# Patient Record
Sex: Female | Born: 1970 | Race: White | Hispanic: No | Marital: Married | Smoking: Never smoker
Health system: Southern US, Community
[De-identification: ages and names within clinical notes are randomized; demographics above are authoritative.]

## PROBLEM LIST (undated history)

## (undated) DIAGNOSIS — E079 Disorder of thyroid, unspecified: Secondary | ICD-10-CM

## (undated) DIAGNOSIS — E119 Type 2 diabetes mellitus without complications: Secondary | ICD-10-CM

## (undated) DIAGNOSIS — L209 Atopic dermatitis, unspecified: Secondary | ICD-10-CM

## (undated) HISTORY — DX: Disorder of thyroid, unspecified: E07.9

## (undated) HISTORY — PX: AUGMENTATION MAMMAPLASTY: SUR837

## (undated) HISTORY — DX: Atopic dermatitis, unspecified: L20.9

## (undated) HISTORY — PX: OTHER SURGICAL HISTORY: SHX169

## (undated) HISTORY — DX: Type 2 diabetes mellitus without complications: E11.9

---
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
## 1998-10-10 ENCOUNTER — Other Ambulatory Visit: Admission: RE | Admit: 1998-10-10 | Discharge: 1998-10-10 | Payer: Self-pay | Admitting: Obstetrics and Gynecology

## 1999-10-15 ENCOUNTER — Other Ambulatory Visit: Admission: RE | Admit: 1999-10-15 | Discharge: 1999-10-15 | Payer: Self-pay | Admitting: Obstetrics and Gynecology

## 2001-01-16 ENCOUNTER — Other Ambulatory Visit: Admission: RE | Admit: 2001-01-16 | Discharge: 2001-01-16 | Payer: Self-pay | Admitting: Obstetrics and Gynecology

## 2001-04-01 ENCOUNTER — Emergency Department (HOSPITAL_COMMUNITY): Admission: EM | Admit: 2001-04-01 | Discharge: 2001-04-02 | Payer: Self-pay | Admitting: Emergency Medicine

## 2001-04-02 ENCOUNTER — Encounter: Payer: Self-pay | Admitting: Emergency Medicine

## 2001-05-02 ENCOUNTER — Emergency Department (HOSPITAL_COMMUNITY): Admission: EM | Admit: 2001-05-02 | Discharge: 2001-05-02 | Payer: Self-pay | Admitting: Emergency Medicine

## 2001-05-02 ENCOUNTER — Encounter: Payer: Self-pay | Admitting: Emergency Medicine

## 2002-02-10 ENCOUNTER — Emergency Department (HOSPITAL_COMMUNITY): Admission: EM | Admit: 2002-02-10 | Discharge: 2002-02-10 | Payer: Self-pay | Admitting: Emergency Medicine

## 2002-07-07 ENCOUNTER — Observation Stay (HOSPITAL_COMMUNITY): Admission: EM | Admit: 2002-07-07 | Discharge: 2002-07-07 | Payer: Self-pay

## 2002-09-12 ENCOUNTER — Emergency Department (HOSPITAL_COMMUNITY): Admission: EM | Admit: 2002-09-12 | Discharge: 2002-09-12 | Payer: Self-pay | Admitting: Emergency Medicine

## 2003-05-08 ENCOUNTER — Encounter: Payer: Self-pay | Admitting: Endocrinology

## 2003-05-08 ENCOUNTER — Encounter: Admission: RE | Admit: 2003-05-08 | Discharge: 2003-05-08 | Payer: Self-pay | Admitting: Endocrinology

## 2003-05-13 ENCOUNTER — Other Ambulatory Visit: Admission: RE | Admit: 2003-05-13 | Discharge: 2003-05-13 | Payer: Self-pay | Admitting: Obstetrics and Gynecology

## 2003-09-06 ENCOUNTER — Ambulatory Visit (HOSPITAL_COMMUNITY): Admission: RE | Admit: 2003-09-06 | Discharge: 2003-09-06 | Payer: Self-pay | Admitting: Obstetrics and Gynecology

## 2003-10-04 ENCOUNTER — Ambulatory Visit (HOSPITAL_COMMUNITY): Admission: RE | Admit: 2003-10-04 | Discharge: 2003-10-04 | Payer: Self-pay | Admitting: Obstetrics and Gynecology

## 2003-10-28 ENCOUNTER — Ambulatory Visit (HOSPITAL_COMMUNITY): Admission: RE | Admit: 2003-10-28 | Discharge: 2003-10-28 | Payer: Self-pay | Admitting: *Deleted

## 2004-07-04 IMAGING — RF DG HYSTEROGRAM
11 series · 11 of 11 positions shown · IV contrast (omnipaque)
Comparison: none

CLINICAL DATA: Primary infertility.  
 HYSTEROSALPINGOGRAM:  
 After cleansing of the cervix and vagina with Betadine, a hysterosalpingogram was performed using a 5 French hysterosalpingogram catheter and Omnipaque 300 contrast.  The patient tolerated the procedure without difficulty.  
 The endometrial cavity of the uterus is normal in appearance.  
 Opacification of both fallopian tubes is seen, and intraperitoneal spill of contrast from both fallopian tubes is demonstrated.
 IMPRESSION
 Normal study.  Both fallopian tubes are patent.

[Series 1: run · 1 of 1 slices shown (1 of 11)]
[im 1/1]
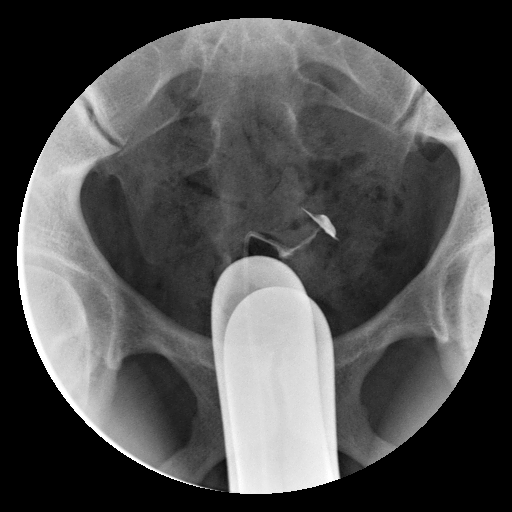

[Series 2: run · 1 of 1 slices shown (2 of 11)]
[im 1/1]
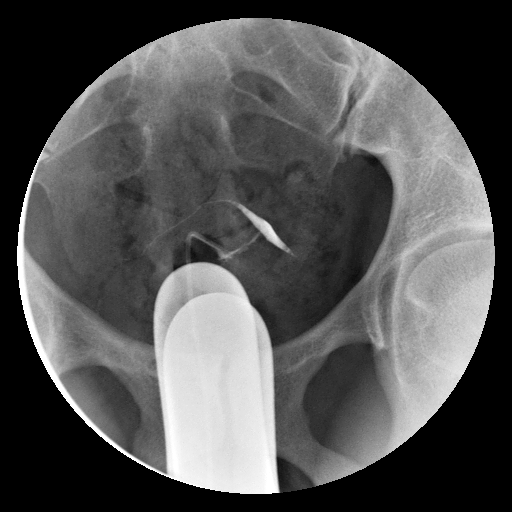

[Series 3: run · 1 of 1 slices shown (3 of 11)]
[im 1/1]
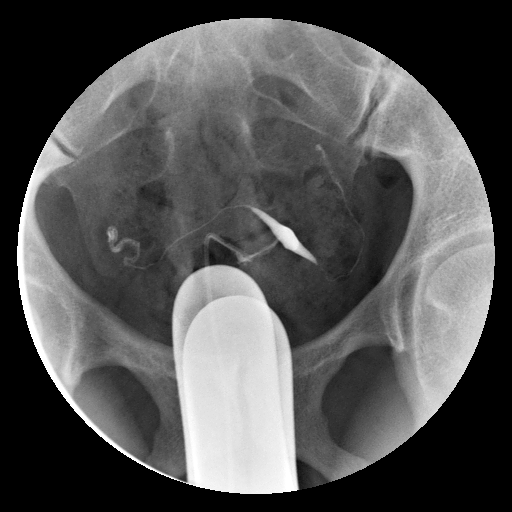

[Series 4: run · 1 of 1 slices shown (4 of 11)]
[im 1/1]
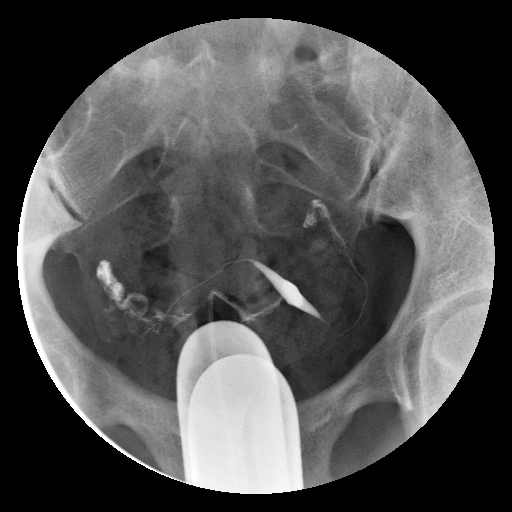

[Series 5: run · 1 of 1 slices shown (5 of 11)]
[im 1/1]
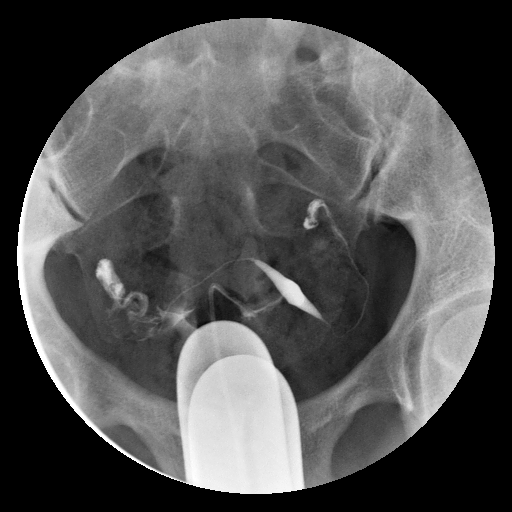

[Series 6: run · 1 of 1 slices shown (6 of 11)]
[im 1/1]
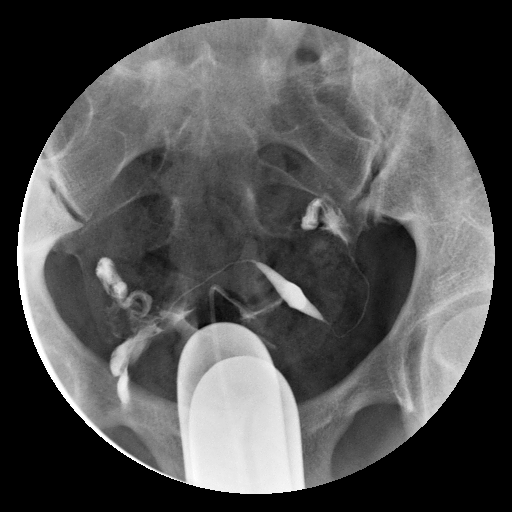

[Series 7: run · 1 of 1 slices shown (7 of 11)]
[im 1/1]
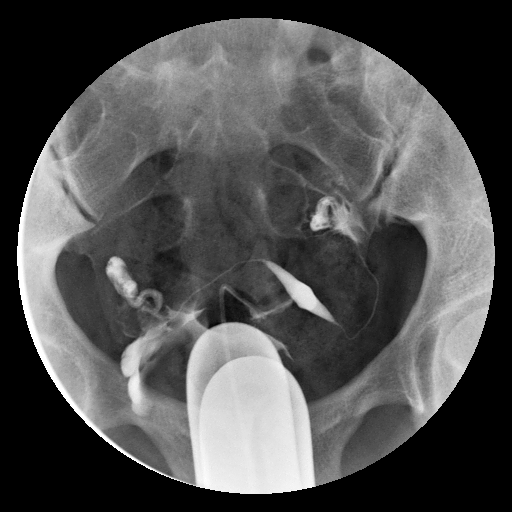

[Series 8: run · 1 of 1 slices shown (8 of 11)]
[im 1/1]
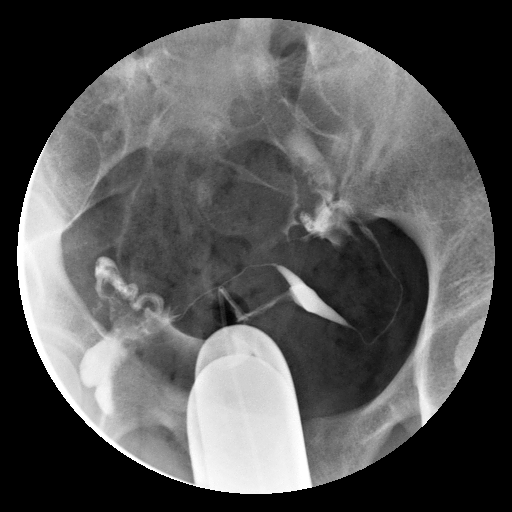

[Series 9: run · 1 of 1 slices shown (9 of 11)]
[im 1/1]
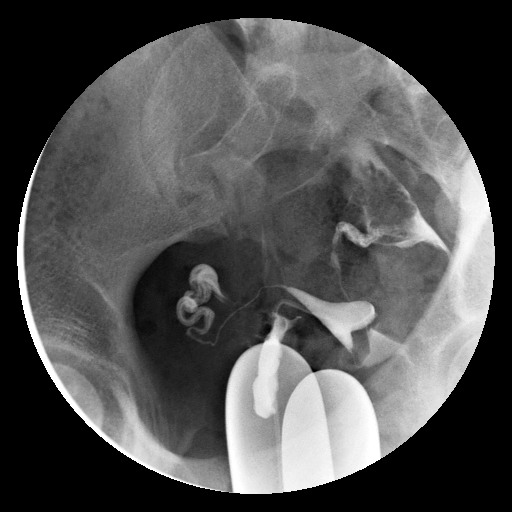

[Series 10: run · 1 of 1 slices shown (10 of 11)]
[im 1/1]
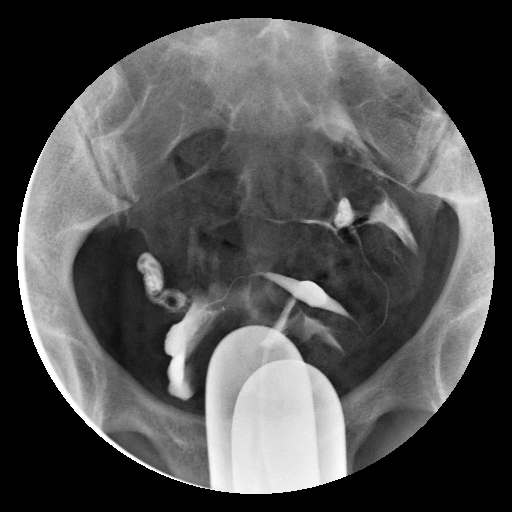

[Series 11: run · 1 of 1 slices shown (11 of 11)]
[im 1/1]
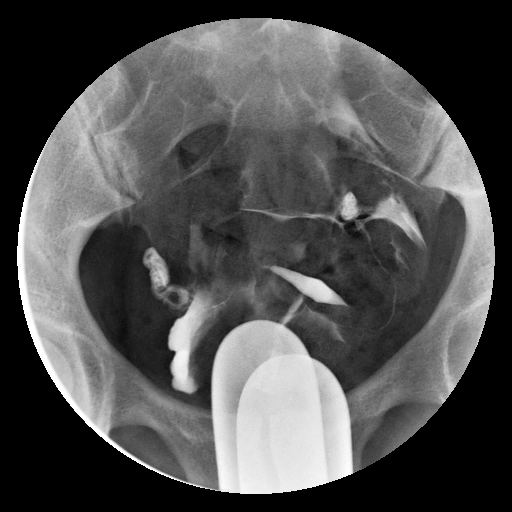

[11 of 11 positions shown; findings below may reference images not displayed]

## 2005-05-18 ENCOUNTER — Other Ambulatory Visit: Admission: RE | Admit: 2005-05-18 | Discharge: 2005-05-18 | Payer: Self-pay | Admitting: Gynecology

## 2007-12-12 ENCOUNTER — Encounter: Admission: RE | Admit: 2007-12-12 | Discharge: 2007-12-12 | Payer: Self-pay | Admitting: Family Medicine

## 2009-04-28 ENCOUNTER — Ambulatory Visit: Payer: Self-pay | Admitting: Internal Medicine

## 2009-04-28 DIAGNOSIS — E109 Type 1 diabetes mellitus without complications: Secondary | ICD-10-CM | POA: Insufficient documentation

## 2009-04-28 DIAGNOSIS — E039 Hypothyroidism, unspecified: Secondary | ICD-10-CM | POA: Insufficient documentation

## 2009-04-28 LAB — CONVERTED CEMR LAB
A-1 Antitrypsin, Ser: 143 mg/dL (ref 83–200)
Anti Nuclear Antibody(ANA): NEGATIVE
Ceruloplasmin: 30 mg/dL (ref 21–63)
HCV Ab: NEGATIVE
Hep B S Ab: POSITIVE — AB
Hepatitis B Surface Ag: NEGATIVE
Progesterone: 0.6 ng/mL
Sex Hormone Binding: 101 nmol/L (ref 18–114)
Testosterone Free: 6.2 pg/mL (ref 0.6–6.8)
Testosterone-% Free: 0.8 % (ref 0.4–2.4)
Testosterone: 76.23 ng/dL — ABNORMAL HIGH (ref 10–70)
Tissue Transglutaminase Ab, IgA: 0.4 units (ref <7)

## 2009-04-29 LAB — CONVERTED CEMR LAB
FSH: 8.7 milliintl units/mL
Ferritin: 21.7 ng/mL (ref 10.0–291.0)
INR: 1 (ref 0.8–1.0)
Iron: 67 ug/dL (ref 42–145)
LH: 5.56 milliintl units/mL
Prolactin: 20.5 ng/mL
Prothrombin Time: 10.6 s — ABNORMAL LOW (ref 10.9–13.3)
Saturation Ratios: 20.8 % (ref 20.0–50.0)
Testosterone: 43.15 ng/dL (ref 10.00–70.00)
Transferrin: 230.5 mg/dL (ref 212.0–360.0)

## 2009-04-30 ENCOUNTER — Telehealth: Payer: Self-pay | Admitting: Internal Medicine

## 2009-05-08 ENCOUNTER — Telehealth: Payer: Self-pay | Admitting: Internal Medicine

## 2009-05-08 ENCOUNTER — Encounter (INDEPENDENT_AMBULATORY_CARE_PROVIDER_SITE_OTHER): Payer: Self-pay | Admitting: *Deleted

## 2009-05-08 DIAGNOSIS — R7401 Elevation of levels of liver transaminase levels: Secondary | ICD-10-CM | POA: Insufficient documentation

## 2009-05-08 DIAGNOSIS — R740 Nonspecific elevation of levels of transaminase and lactic acid dehydrogenase [LDH]: Secondary | ICD-10-CM

## 2009-05-09 ENCOUNTER — Ambulatory Visit (HOSPITAL_COMMUNITY): Admission: RE | Admit: 2009-05-09 | Discharge: 2009-05-09 | Payer: Self-pay | Admitting: Internal Medicine

## 2009-06-18 ENCOUNTER — Encounter: Admission: RE | Admit: 2009-06-18 | Discharge: 2009-07-18 | Payer: Self-pay | Admitting: Certified Nurse Midwife

## 2009-06-18 ENCOUNTER — Ambulatory Visit: Admission: RE | Admit: 2009-06-18 | Discharge: 2009-06-18 | Payer: Self-pay | Admitting: Certified Nurse Midwife

## 2009-07-19 ENCOUNTER — Encounter: Admission: RE | Admit: 2009-07-19 | Discharge: 2009-07-25 | Payer: Self-pay | Admitting: Certified Nurse Midwife

## 2010-10-15 ENCOUNTER — Ambulatory Visit (HOSPITAL_BASED_OUTPATIENT_CLINIC_OR_DEPARTMENT_OTHER): Admission: RE | Admit: 2010-10-15 | Payer: Self-pay | Admitting: Orthopedic Surgery

## 2010-11-21 ENCOUNTER — Encounter: Payer: Self-pay | Admitting: *Deleted

## 2011-03-19 NOTE — Discharge Summary (Signed)
NAMEHANIAH, PENNY                          ACCOUNT NO.:  [id number]   MEDICAL RECORD NO.:  [id number]                   PATIENT TYPE:  INP   LOCATION:  0478                                 FACILITY:  [facility]   PHYSICIAN:  Tera Mater. Evlyn Kanner, M.D.              DATE OF BIRTH:  1971/06/02   DATE OF ADMISSION:  07/06/2002  DATE OF DISCHARGE:  07/07/2002                                 DISCHARGE SUMMARY   FINAL DIAGNOSES:  1. Nausea and vomiting, probable viral gastroenteritis, improved.  2. Dehydration secondary to #1.  3. Known type one diabetes without diabetic ketoacidosis.  4. Mild hyperglycemia.   HISTORY OF PRESENT ILLNESS:  The patient is a 40 year old white female with  a 13 year history of type one diabetes, no other major medical problems.  She had presented with many episodes of nausea and vomiting on 07/07/02.  She  was volume depleted and in the risk of entering diabetic ketoacidosis.  She  was treated with antiemetic therapy, fluid resuscitation and initial n.p.o.  status.  Extensive laboratory testing ruled out the possibility of an intra-  abdominal problem such as pancreatitis or hepatitis.  Her sugar was up  slightly at 346, but without any evidence of ketosis or acidosis of  significance.  She responded well to resuscitative therapy, and after some  rest and slowing advancing diet was discharged home in improved condition on  the afternoon of 07/07/02.  She was taking oral diet.  Blood sugar had  improved down to the 150 range, and she was doing well.   DISCHARGE MEDICATIONS:  1. Resumption of her Novolog via pump.  2. OCP.   FOLLOWUP:  With me in two to three weeks.   DISCHARGE INSTRUCTIONS:  She was to call if nausea or vomiting occurred or  other problems.   LABORATORY DATA:  White count 12,800, hemoglobin 14.6, platelets 222,000.  Sodium 137, potassium 4.7, chloride 101, CO2 22, BUN 18, creatinine 1.0,  calcium 9.8, glucose 346.  At 1000 hours on 07/07/02,  sodium was 141,  potassium 3.6, chloride 112, CO2 22, BUN 17, creatinine 0.8, glucose 208,  calcium 8.3.  Lipase was 13, amylase was 48.  Urinalysis was greater than 1  g/dL glucose, greater 80 mg/dL ketones, but serum ketones were negative.  Urine pregnancy test was negative.   In summary, we have a 40 year old white female with known type 1 diabetes  presenting with nausea and vomiting.  She has some volume deficits, but no  diabetic ketoacidosis.  She is treated with antiemetic therapy and fluid  resuscitation and did quite well.  She is discharged home in improved  condition on 07/07/02.  Tera Mater. Evlyn Kanner, M.D.    SAS/MEDQ  D:  07/15/2002  T:  07/15/2002  Job:  [id number]

## 2011-05-06 ENCOUNTER — Other Ambulatory Visit: Payer: Self-pay | Admitting: Family Medicine

## 2011-05-06 DIAGNOSIS — J329 Chronic sinusitis, unspecified: Secondary | ICD-10-CM

## 2011-05-10 ENCOUNTER — Ambulatory Visit
Admission: RE | Admit: 2011-05-10 | Discharge: 2011-05-10 | Disposition: A | Discharge status: Home / Self Care | Payer: BC Managed Care – PPO | Source: Ambulatory Visit | Attending: Family Medicine | Admitting: Family Medicine

## 2011-05-10 ENCOUNTER — Other Ambulatory Visit: Payer: Self-pay

## 2011-05-10 DIAGNOSIS — J329 Chronic sinusitis, unspecified: Secondary | ICD-10-CM

## 2012-09-18 ENCOUNTER — Other Ambulatory Visit: Payer: Self-pay | Admitting: Family Medicine

## 2012-09-18 DIAGNOSIS — Z1231 Encounter for screening mammogram for malignant neoplasm of breast: Secondary | ICD-10-CM

## 2012-11-08 ENCOUNTER — Ambulatory Visit
Admission: RE | Admit: 2012-11-08 | Discharge: 2012-11-08 | Disposition: A | Discharge status: Home / Self Care | Payer: BC Managed Care – PPO | Source: Ambulatory Visit | Attending: Family Medicine | Admitting: Family Medicine

## 2012-11-08 DIAGNOSIS — Z1231 Encounter for screening mammogram for malignant neoplasm of breast: Secondary | ICD-10-CM

## 2013-09-24 ENCOUNTER — Other Ambulatory Visit: Payer: Self-pay

## 2013-09-24 DIAGNOSIS — Z1231 Encounter for screening mammogram for malignant neoplasm of breast: Secondary | ICD-10-CM

## 2013-11-16 ENCOUNTER — Other Ambulatory Visit: Payer: Self-pay

## 2013-11-16 DIAGNOSIS — Z1231 Encounter for screening mammogram for malignant neoplasm of breast: Secondary | ICD-10-CM

## 2013-11-16 DIAGNOSIS — Z9882 Breast implant status: Secondary | ICD-10-CM

## 2013-11-26 ENCOUNTER — Ambulatory Visit
Admission: RE | Admit: 2013-11-26 | Discharge: 2013-11-26 | Disposition: A | Discharge status: Home / Self Care | Payer: BC Managed Care – PPO | Source: Ambulatory Visit

## 2013-11-26 DIAGNOSIS — Z1231 Encounter for screening mammogram for malignant neoplasm of breast: Secondary | ICD-10-CM

## 2013-11-26 DIAGNOSIS — Z9882 Breast implant status: Secondary | ICD-10-CM

## 2014-10-31 ENCOUNTER — Other Ambulatory Visit: Payer: Self-pay

## 2014-10-31 DIAGNOSIS — N644 Mastodynia: Secondary | ICD-10-CM

## 2014-11-06 ENCOUNTER — Other Ambulatory Visit: Payer: Self-pay

## 2014-11-06 ENCOUNTER — Other Ambulatory Visit: Payer: Self-pay | Admitting: Plastic and Reconstructive Surgery

## 2014-11-06 DIAGNOSIS — N644 Mastodynia: Secondary | ICD-10-CM

## 2014-11-15 ENCOUNTER — Ambulatory Visit
Admission: RE | Admit: 2014-11-15 | Discharge: 2014-11-15 | Disposition: A | Discharge status: Home / Self Care | Payer: BLUE CROSS/BLUE SHIELD | Source: Ambulatory Visit

## 2014-11-15 DIAGNOSIS — N644 Mastodynia: Secondary | ICD-10-CM

## 2015-07-30 ENCOUNTER — Other Ambulatory Visit: Payer: Self-pay | Admitting: Allergy

## 2015-07-30 MED ORDER — MONTELUKAST SODIUM 10 MG PO TABS: 10.0000 mg | ORAL_TABLET | Freq: Every day | ORAL | Status: DC

## 2015-10-22 ENCOUNTER — Other Ambulatory Visit: Payer: Self-pay

## 2015-10-22 DIAGNOSIS — Z1231 Encounter for screening mammogram for malignant neoplasm of breast: Secondary | ICD-10-CM

## 2015-11-18 ENCOUNTER — Ambulatory Visit: Payer: BLUE CROSS/BLUE SHIELD

## 2015-11-21 ENCOUNTER — Ambulatory Visit
Admission: RE | Admit: 2015-11-21 | Discharge: 2015-11-21 | Disposition: A | Discharge status: Home / Self Care | Payer: 59 | Source: Ambulatory Visit

## 2015-11-21 DIAGNOSIS — Z1231 Encounter for screening mammogram for malignant neoplasm of breast: Secondary | ICD-10-CM

## 2016-05-28 DIAGNOSIS — H01009 Unspecified blepharitis unspecified eye, unspecified eyelid: Secondary | ICD-10-CM | POA: Diagnosis not present

## 2016-06-02 DIAGNOSIS — E1065 Type 1 diabetes mellitus with hyperglycemia: Secondary | ICD-10-CM | POA: Diagnosis not present

## 2016-06-16 ENCOUNTER — Ambulatory Visit: Payer: Self-pay | Admitting: Allergy and Immunology

## 2016-08-24 DIAGNOSIS — J069 Acute upper respiratory infection, unspecified: Secondary | ICD-10-CM | POA: Diagnosis not present

## 2016-10-13 ENCOUNTER — Other Ambulatory Visit: Payer: Self-pay | Admitting: Family Medicine

## 2016-10-13 DIAGNOSIS — Z1231 Encounter for screening mammogram for malignant neoplasm of breast: Secondary | ICD-10-CM

## 2016-11-23 ENCOUNTER — Ambulatory Visit: Payer: 59

## 2016-11-29 ENCOUNTER — Ambulatory Visit: Payer: 59

## 2016-12-13 DIAGNOSIS — E039 Hypothyroidism, unspecified: Secondary | ICD-10-CM | POA: Diagnosis not present

## 2016-12-13 DIAGNOSIS — E109 Type 1 diabetes mellitus without complications: Secondary | ICD-10-CM | POA: Diagnosis not present

## 2016-12-13 DIAGNOSIS — R5383 Other fatigue: Secondary | ICD-10-CM | POA: Diagnosis not present

## 2016-12-16 DIAGNOSIS — M9905 Segmental and somatic dysfunction of pelvic region: Secondary | ICD-10-CM | POA: Diagnosis not present

## 2016-12-16 DIAGNOSIS — M9903 Segmental and somatic dysfunction of lumbar region: Secondary | ICD-10-CM | POA: Diagnosis not present

## 2016-12-16 DIAGNOSIS — M9901 Segmental and somatic dysfunction of cervical region: Secondary | ICD-10-CM | POA: Diagnosis not present

## 2016-12-16 DIAGNOSIS — M9902 Segmental and somatic dysfunction of thoracic region: Secondary | ICD-10-CM | POA: Diagnosis not present

## 2016-12-20 DIAGNOSIS — M9903 Segmental and somatic dysfunction of lumbar region: Secondary | ICD-10-CM | POA: Diagnosis not present

## 2016-12-20 DIAGNOSIS — M9905 Segmental and somatic dysfunction of pelvic region: Secondary | ICD-10-CM | POA: Diagnosis not present

## 2016-12-20 DIAGNOSIS — M9902 Segmental and somatic dysfunction of thoracic region: Secondary | ICD-10-CM | POA: Diagnosis not present

## 2016-12-20 DIAGNOSIS — M9901 Segmental and somatic dysfunction of cervical region: Secondary | ICD-10-CM | POA: Diagnosis not present

## 2016-12-21 DIAGNOSIS — E1065 Type 1 diabetes mellitus with hyperglycemia: Secondary | ICD-10-CM | POA: Diagnosis not present

## 2016-12-23 DIAGNOSIS — M9902 Segmental and somatic dysfunction of thoracic region: Secondary | ICD-10-CM | POA: Diagnosis not present

## 2016-12-23 DIAGNOSIS — M9903 Segmental and somatic dysfunction of lumbar region: Secondary | ICD-10-CM | POA: Diagnosis not present

## 2016-12-23 DIAGNOSIS — M9901 Segmental and somatic dysfunction of cervical region: Secondary | ICD-10-CM | POA: Diagnosis not present

## 2016-12-23 DIAGNOSIS — M9905 Segmental and somatic dysfunction of pelvic region: Secondary | ICD-10-CM | POA: Diagnosis not present

## 2016-12-28 ENCOUNTER — Ambulatory Visit
Admission: RE | Admit: 2016-12-28 | Discharge: 2016-12-28 | Disposition: A | Discharge status: Home / Self Care | Payer: BLUE CROSS/BLUE SHIELD | Source: Ambulatory Visit | Attending: Family Medicine | Admitting: Family Medicine

## 2016-12-28 DIAGNOSIS — Z1231 Encounter for screening mammogram for malignant neoplasm of breast: Secondary | ICD-10-CM | POA: Diagnosis not present

## 2016-12-29 DIAGNOSIS — M9905 Segmental and somatic dysfunction of pelvic region: Secondary | ICD-10-CM | POA: Diagnosis not present

## 2016-12-29 DIAGNOSIS — M9903 Segmental and somatic dysfunction of lumbar region: Secondary | ICD-10-CM | POA: Diagnosis not present

## 2016-12-29 DIAGNOSIS — M9901 Segmental and somatic dysfunction of cervical region: Secondary | ICD-10-CM | POA: Diagnosis not present

## 2016-12-29 DIAGNOSIS — M9902 Segmental and somatic dysfunction of thoracic region: Secondary | ICD-10-CM | POA: Diagnosis not present

## 2017-01-04 DIAGNOSIS — M9905 Segmental and somatic dysfunction of pelvic region: Secondary | ICD-10-CM | POA: Diagnosis not present

## 2017-01-04 DIAGNOSIS — M9901 Segmental and somatic dysfunction of cervical region: Secondary | ICD-10-CM | POA: Diagnosis not present

## 2017-01-04 DIAGNOSIS — M9903 Segmental and somatic dysfunction of lumbar region: Secondary | ICD-10-CM | POA: Diagnosis not present

## 2017-01-04 DIAGNOSIS — M9902 Segmental and somatic dysfunction of thoracic region: Secondary | ICD-10-CM | POA: Diagnosis not present

## 2017-01-11 DIAGNOSIS — M9905 Segmental and somatic dysfunction of pelvic region: Secondary | ICD-10-CM | POA: Diagnosis not present

## 2017-01-11 DIAGNOSIS — M9903 Segmental and somatic dysfunction of lumbar region: Secondary | ICD-10-CM | POA: Diagnosis not present

## 2017-01-11 DIAGNOSIS — M9901 Segmental and somatic dysfunction of cervical region: Secondary | ICD-10-CM | POA: Diagnosis not present

## 2017-01-11 DIAGNOSIS — M9902 Segmental and somatic dysfunction of thoracic region: Secondary | ICD-10-CM | POA: Diagnosis not present

## 2017-01-17 DIAGNOSIS — M9901 Segmental and somatic dysfunction of cervical region: Secondary | ICD-10-CM | POA: Diagnosis not present

## 2017-01-17 DIAGNOSIS — M9902 Segmental and somatic dysfunction of thoracic region: Secondary | ICD-10-CM | POA: Diagnosis not present

## 2017-01-17 DIAGNOSIS — M9905 Segmental and somatic dysfunction of pelvic region: Secondary | ICD-10-CM | POA: Diagnosis not present

## 2017-01-17 DIAGNOSIS — M9903 Segmental and somatic dysfunction of lumbar region: Secondary | ICD-10-CM | POA: Diagnosis not present

## 2017-03-21 DIAGNOSIS — E109 Type 1 diabetes mellitus without complications: Secondary | ICD-10-CM | POA: Diagnosis not present

## 2017-04-14 DIAGNOSIS — E1065 Type 1 diabetes mellitus with hyperglycemia: Secondary | ICD-10-CM | POA: Diagnosis not present

## 2017-08-23 DIAGNOSIS — Z6824 Body mass index (BMI) 24.0-24.9, adult: Secondary | ICD-10-CM | POA: Diagnosis not present

## 2017-08-23 DIAGNOSIS — Z1151 Encounter for screening for human papillomavirus (HPV): Secondary | ICD-10-CM | POA: Diagnosis not present

## 2017-08-23 DIAGNOSIS — Z01419 Encounter for gynecological examination (general) (routine) without abnormal findings: Secondary | ICD-10-CM | POA: Diagnosis not present

## 2017-12-21 DIAGNOSIS — E10649 Type 1 diabetes mellitus with hypoglycemia without coma: Secondary | ICD-10-CM | POA: Diagnosis not present

## 2017-12-21 DIAGNOSIS — E109 Type 1 diabetes mellitus without complications: Secondary | ICD-10-CM | POA: Diagnosis not present

## 2017-12-21 DIAGNOSIS — R748 Abnormal levels of other serum enzymes: Secondary | ICD-10-CM | POA: Diagnosis not present

## 2017-12-21 DIAGNOSIS — E039 Hypothyroidism, unspecified: Secondary | ICD-10-CM | POA: Diagnosis not present

## 2017-12-28 ENCOUNTER — Telehealth: Payer: Self-pay | Admitting: Allergy

## 2017-12-28 NOTE — Telephone Encounter (Signed)
[place] gave patient appointment for Monday March 4th  at 9:30.Patient said that was good with her.

## 2017-12-28 NOTE — Telephone Encounter (Signed)
Let us double book her into a new patient morning slot with me in [place].  Thanks.

## 2017-12-28 NOTE — Telephone Encounter (Signed)
Dr. Nunzio CobbsBobbitt, Patient called and wanted appointment with only you. Saw Dr. Clydie BraunBhatti. She knows Dr. Clydie BraunBhatti is no longer her. It has been over three years since she was seen. The first appointment is for March 21. She said her eyes were itchy, swollen red underneath. Said she was using cold compressions and ice packs on them but it comes right back. Said left was worse than the right. She is using Alaway eye drops, taking Montelukast ,zyrtec and using Flonase prn. I told her she would have to come in as a new patient.She asked me to send a message to you. Phone number [phone]. Please advise.Said she would go to [place] but they didn't have appointment any early. Thank you.

## 2018-01-02 ENCOUNTER — Ambulatory Visit: Payer: Self-pay | Admitting: Allergy and Immunology

## 2018-01-10 ENCOUNTER — Ambulatory Visit (INDEPENDENT_AMBULATORY_CARE_PROVIDER_SITE_OTHER): Payer: BLUE CROSS/BLUE SHIELD | Admitting: Allergy and Immunology

## 2018-01-10 ENCOUNTER — Encounter: Payer: Self-pay | Admitting: Allergy and Immunology

## 2018-01-10 VITALS — BP 120/70 | HR 76 | Temp 98.2°F | Resp 20 | Ht 65.0 in | Wt 154.6 lb

## 2018-01-10 DIAGNOSIS — L2089 Other atopic dermatitis: Secondary | ICD-10-CM | POA: Diagnosis not present

## 2018-01-10 DIAGNOSIS — H1013 Acute atopic conjunctivitis, bilateral: Secondary | ICD-10-CM

## 2018-01-10 DIAGNOSIS — L209 Atopic dermatitis, unspecified: Secondary | ICD-10-CM | POA: Insufficient documentation

## 2018-01-10 DIAGNOSIS — J3089 Other allergic rhinitis: Secondary | ICD-10-CM | POA: Diagnosis not present

## 2018-01-10 DIAGNOSIS — H1010 Acute atopic conjunctivitis, unspecified eye: Secondary | ICD-10-CM | POA: Insufficient documentation

## 2018-01-10 HISTORY — DX: Atopic dermatitis, unspecified: L20.9

## 2018-01-10 MED ORDER — OLOPATADINE HCL 0.7 % OP SOLN: 1.0000 [drp] | OPHTHALMIC | 5 refills | Status: DC

## 2018-01-10 MED ORDER — FLUTICASONE PROPIONATE 93 MCG/ACT NA EXHU: 2.0000 | INHALANT_SUSPENSION | Freq: Two times a day (BID) | NASAL | 5 refills | Status: DC

## 2018-01-10 MED ORDER — LEVOCETIRIZINE DIHYDROCHLORIDE 5 MG PO TABS: 5.0000 mg | ORAL_TABLET | Freq: Every day | ORAL | 5 refills | Status: DC | PRN

## 2018-01-10 MED ORDER — CRISABOROLE 2 % EX OINT: 1.0000 "application " | TOPICAL_OINTMENT | Freq: Two times a day (BID) | CUTANEOUS | 2 refills | Status: DC | PRN

## 2018-01-10 NOTE — Assessment & Plan Note (Signed)
   Aeroallergen avoidance measures have been discussed and provided in written form.  A prescription has been provided for levocetirizine, 5 mg daily as needed.  [facility], 2 actuations per nostril twice a day. Proper technique has been discussed and demonstrated.  Nasal saline spray (i.e., Simply Saline) or nasal saline lavage (i.e., NeilMed) is recommended as needed and prior to medicated nasal sprays.  The patient is scheduled to return in the near future for allergy skin testing after having been off of antihistamines for at least 3 days.    Consider immunotherapy if insurance coverage is favorable.

## 2018-01-10 NOTE — Assessment & Plan Note (Signed)
   Appropriate skin care recommendations have been provided verbally and in written form.  A prescription has been provided for Eucrisa (crisaborole) 2% ointment twice a day to affected areas as needed.

## 2018-01-10 NOTE — Assessment & Plan Note (Signed)
   Treatment plan as outlined above for allergic rhinitis.  A prescription has been provided for Pazeo, one drop per eye daily as needed.  I have also recommended eye lubricant drops (i.e., Natural Tears) as needed. 

## 2018-01-10 NOTE — Patient Instructions (Addendum)
Allergic rhinitis  Aeroallergen avoidance measures have been discussed and provided in written form.  A prescription has been provided for levocetirizine, 5 mg daily as needed.  A prescription has been provided for [facility], 2 actuations per nostril twice a day. Proper technique has been discussed and demonstrated.  Nasal saline spray (i.e., Simply Saline) or nasal saline lavage (i.e., NeilMed) is recommended as needed and prior to medicated nasal sprays.  The patient is scheduled to return in the near future for allergy skin testing after having been off of antihistamines for at least 3 days.    Consider immunotherapy if insurance coverage is favorable.   Allergic conjunctivitis  Treatment plan as outlined above for allergic rhinitis.  A prescription has been provided for Pazeo, one drop per eye daily as needed.  I have also recommended eye lubricant drops (i.e., Natural Tears) as needed.  Atopic dermatitis  Appropriate skin care recommendations have been provided verbally and in written form.  A prescription has been provided for Eucrisa (crisaborole) 2% ointment twice a day to affected areas as needed.   Return in the near future for allergy skin testing after having been off of antihistamines for at least 3 days.   ECZEMA SKIN CARE REGIMEN:  Bathed and soak for 10 minutes in warm water once today. Pat dry.  Immediately apply the below creams: To healthy skin apply Aquaphor or Vaseline jelly twice a day. To affected areas apply: . Eucrisa (crisaborole) 2% ointment twice a day to affected areas as needed. . This medication may be used sparingly on the eyelids, however be careful to avoid the eyes.   Reducing Pollen Exposure  The American Academy of Allergy, Asthma and Immunology suggests the following steps to reduce your exposure to pollen during allergy seasons.    1. Do not hang sheets or clothing out to dry; pollen may collect on these items. 2. Do not mow lawns or  spend time around freshly cut grass; mowing stirs up pollen. 3. Keep windows closed at night.  Keep car windows closed while driving. 4. Minimize morning activities outdoors, a time when pollen counts are usually at their highest. 5. Stay indoors as much as possible when pollen counts or humidity is high and on windy days when pollen tends to remain in the air longer. 6. Use air conditioning when possible.  Many air conditioners have filters that trap the pollen spores. 7. Use a HEPA room air filter to remove pollen form the indoor air you breathe.   Control of Dog or Cat Allergen  Avoidance is the best way to manage a dog or cat allergy. If you have a dog or cat and are allergic to dog or cats, consider removing the dog or cat from the home. If you have a dog or cat but don't want to find it a new home, or if your family wants a pet even though someone in the household is allergic, here are some strategies that may help keep symptoms at bay:  1. Keep the pet out of your bedroom and restrict it to only a few rooms. Be advised that keeping the dog or cat in only one room will not limit the allergens to that room. 2. Don't pet, hug or kiss the dog or cat; if you do, wash your hands with soap and water. 3. High-efficiency particulate air (HEPA) cleaners run continuously in a bedroom or living room can reduce allergen levels over time. 4. Place electrostatic material sheet in the air inlet  vent in the bedroom. 5. Regular use of a high-efficiency vacuum cleaner or a central vacuum can reduce allergen levels. 6. Giving your dog or cat a bath at least once a week can reduce airborne allergen.  Control of House Dust Mite Allergen  House dust mites play a major role in allergic asthma and rhinitis.  They occur in environments with high humidity wherever human skin, the food for dust mites is found. High levels have been detected in dust obtained from mattresses, pillows, carpets, upholstered furniture,  bed covers, clothes and soft toys.  The principal allergen of the house dust mite is found in its feces.  A gram of dust may contain 1,000 mites and 250,000 fecal particles.  Mite antigen is easily measured in the air during house cleaning activities.    1. Encase mattresses, including the box spring, and pillow, in an air tight cover.  Seal the zipper end of the encased mattresses with wide adhesive tape. 2. Wash the bedding in water of 130 degrees Farenheit weekly.  Avoid cotton comforters/quilts and flannel bedding: the most ideal bed covering is the dacron comforter. 3. Remove all upholstered furniture from the bedroom. 4. Remove carpets, carpet padding, rugs, and non-washable window drapes from the bedroom.  Wash drapes weekly or use plastic window coverings. 5. Remove all non-washable stuffed toys from the bedroom.  Wash stuffed toys weekly. 6. Have the room cleaned frequently with a vacuum cleaner and a damp dust-mop.  The patient should not be in a room which is being cleaned and should wait 1 hour after cleaning before going into the room. 7. Close and seal all heating outlets in the bedroom.  Otherwise, the room will become filled with dust-laden air.  An electric heater can be used to heat the room. 8. Reduce indoor humidity to less than 50%.  Do not use a humidifier.

## 2018-01-10 NOTE — Progress Notes (Signed)
New Patient Note  RE: Nancy King MRN: [id number] DOB: 21-Apr-1971 Date of Office Visit: 01/10/2018  Referring provider: No ref. provider found Primary care provider: Johny Blamer, MD  Chief Complaint: Conjunctivitis; Allergic Rhinitis ; and Sinus Problem   History of present illness: Nancy King is a 47 y.o. female presenting today for evaluation of rhinoconjunctivitis.  She complains of severe ocular pruritus, lacrimation, redness, and mild periorbital edema.  She states that these ocular symptoms at times are "unbearable".  In addition, she experiences erythematous, highly pruritic patches on her lower eyelids.  She also experiences nasal congestion, rhinorrhea, sneezing, nasal pruritus, postnasal drainage, and occasional sinus pressure.  These symptoms occur year around but are more frequent and severe during the springtime.  She is currently taking cetirizine, montelukast, diphenhydramine, fluticasone nasal spray, and Alaway eyedrops without adequate symptom relief.  She had been on aeroallergen immunotherapy several years ago, however did not reach maintenance dose because of a change in her insurance making this therapy too expensive for her.   Assessment and plan: Allergic rhinitis  Aeroallergen avoidance measures have been discussed and provided in written form.  A prescription has been provided for levocetirizine, 5 mg daily as needed.  A prescription has been provided for [facility], 2 actuations per nostril twice a day. Proper technique has been discussed and demonstrated.  Nasal saline spray (i.e., Simply Saline) or nasal saline lavage (i.e., NeilMed) is recommended as needed and prior to medicated nasal sprays.  The patient is scheduled to return in the near future for allergy skin testing after having been off of antihistamines for at least 3 days.    Consider immunotherapy if insurance coverage is favorable.   Allergic conjunctivitis  Treatment plan as outlined  above for allergic rhinitis.  A prescription has been provided for Pazeo, one drop per eye daily as needed.  I have also recommended eye lubricant drops (i.e., Natural Tears) as needed.  Atopic dermatitis  Appropriate skin care recommendations have been provided verbally and in written form.  A prescription has been provided for Eucrisa (crisaborole) 2% ointment twice a day to affected areas as needed.   Meds ordered this encounter  Medications  . levocetirizine (XYZAL) 5 MG tablet    Sig: Take 1 tablet (5 mg total) by mouth daily as needed for allergies.    Dispense:  30 tablet    Refill:  5  . Fluticasone Propionate (XHANCE) 93 MCG/ACT EXHU    Sig: Place 2 puffs into the nose 2 (two) times daily.    Dispense:  16 mL    Refill:  5  . Olopatadine HCl (PAZEO) 0.7 % SOLN    Sig: Place 1 drop into both eyes 1 day or 1 dose.    Dispense:  1 Bottle    Refill:  5  . Crisaborole (EUCRISA) 2 % OINT    Sig: Apply 1 application topically 2 (two) times daily as needed.    Dispense:  60 g    Refill:  2    Diagnostics: Allergy skin testing: We were unable to perform skin tests today due to recent administration of antihistamine.     Physical examination: Blood pressure 120/70, pulse 76, temperature 98.2 F (36.8 C), temperature source Oral, resp. rate 20, height 5\' 5"  (1.651 m), weight 154 lb 9.6 oz (70.1 kg).  General: Alert, interactive, in no acute distress. HEENT: TMs pearly gray, turbinates moderately edematous with crusty discharge, post-pharynx moderately erythematous. Neck: Supple without lymphadenopathy. Lungs: Clear to  auscultation without wheezing, rhonchi or rales. CV: Normal S1, S2 without murmurs. Abdomen: Nondistended, nontender. Skin: Warm and dry, without lesions or rashes. Extremities:  No clubbing, cyanosis or edema. Neuro:   Grossly intact.  Review of systems:  Review of systems negative except as noted in HPI / PMHx or noted below: Review of Systems    Constitutional: Negative.   HENT: Negative.   Eyes: Negative.   Respiratory: Negative.   Cardiovascular: Negative.   Gastrointestinal: Negative.   Genitourinary: Negative.   Musculoskeletal: Negative.   Skin: Negative.   Neurological: Negative.   Endo/Heme/Allergies: Negative.   Psychiatric/Behavioral: Negative.     Past medical history: Past Medical History:  Diagnosis Date  . Atopic dermatitis 01/10/2018  . Diabetes mellitus (HCC) 1989  . Thyroid disease     Past surgical history:  Past Surgical History:  Procedure Laterality Date  . plyca resection Left    left knee    Family history: Family History  Problem Relation Age of Onset  . Dementia Mother   . Stroke Father   . Diabetes Paternal Grandmother   . Throat cancer Paternal Grandfather     Social history: Social History   Socioeconomic History  . Marital status: Married    Spouse name: Not on file  . Number of children: Not on file  . Years of education: Not on file  . Highest education level: Not on file  Social Needs  . Financial resource strain: Not on file  . Food insecurity - worry: Not on file  . Food insecurity - inability: Not on file  . Transportation needs - medical: Not on file  . Transportation needs - non-medical: Not on file  Occupational History  . Not on file  Tobacco Use  . Smoking status: Never Smoker  . Smokeless tobacco: Never Used  Substance and Sexual Activity  . Alcohol use: No    Frequency: Never  . Drug use: No  . Sexual activity: Not on file  Other Topics Concern  . Not on file  Social History Narrative  . Not on file   Environmental History: The patient lives in a house built in the 1970s with hardwood floors throughout, gas heat, and central air.  There is a dog in the house which has access to her bedroom.  She is a non-smoker.  There is no known mold/water damage in the home.  Allergies as of 01/10/2018   No Known Allergies     Medication List         Accurate as of 01/10/18  3:42 PM. Always use your most recent med list.          ALAWAY 0.025 % ophthalmic solution Generic drug:  ketotifen Place 1 drop into both eyes 2 (two) times daily.   aspirin EC 81 MG tablet Take 81 mg by mouth daily.   cetirizine 10 MG tablet Commonly known as:  ZYRTEC Take 10 mg by mouth daily.   Crisaborole 2 % Oint Commonly known as:  EUCRISA Apply 1 application topically 2 (two) times daily as needed.   diphenhydrAMINE 25 MG tablet Commonly known as:  BENADRYL Take 25 mg by mouth every 4 (four) hours as needed.   Fish Oil 1200 MG Caps Take 1 capsule by mouth daily.   fluticasone 50 MCG/ACT nasal spray Commonly known as:  FLONASE Place 1 spray into both nostrils 2 (two) times daily.   Fluticasone Propionate 93 MCG/ACT Exhu Commonly known as:  XHANCE Place 2 puffs into  the nose 2 (two) times daily.   levocetirizine 5 MG tablet Commonly known as:  XYZAL Take 1 tablet (5 mg total) by mouth daily as needed for allergies.   metFORMIN 500 MG tablet Commonly known as:  GLUCOPHAGE Take 1,500 mg by mouth at bedtime.   montelukast 10 MG tablet Commonly known as:  SINGULAIR Take 1 tablet (10 mg total) by mouth at bedtime.   multivitamin capsule Take 1 capsule by mouth daily.   NOVOLOG Economy Inject into the skin.   Olopatadine HCl 0.7 % Soln Commonly known as:  PAZEO Place 1 drop into both eyes 1 day or 1 dose.   thyroid 60 MG tablet Commonly known as:  ARMOUR Take 60 mg by mouth daily before breakfast.   thyroid 30 MG tablet Commonly known as:  ARMOUR Take 30 mg by mouth every evening.   TURMERIC PO Take 2 tablets by mouth daily.   Vitamin D3 5000 units Caps Take 1 capsule by mouth daily.       Known medication allergies: No Known Allergies  I appreciate the opportunity to take part in Devynne's care. Please do not hesitate to contact me with questions.  Sincerely,   R. Jorene Guest, MD

## 2018-01-11 ENCOUNTER — Telehealth: Payer: Self-pay

## 2018-01-11 ENCOUNTER — Other Ambulatory Visit: Payer: Self-pay

## 2018-01-11 DIAGNOSIS — J3089 Other allergic rhinitis: Secondary | ICD-10-CM

## 2018-01-11 MED ORDER — FLUTICASONE PROPIONATE 93 MCG/ACT NA EXHU: 2.0000 | INHALANT_SUSPENSION | Freq: Two times a day (BID) | NASAL | 5 refills | Status: AC

## 2018-01-11 NOTE — Telephone Encounter (Signed)
PTS insurance will not cover pazeo is it ok to change to olopatadine? Please advise

## 2018-01-12 ENCOUNTER — Other Ambulatory Visit: Payer: Self-pay

## 2018-01-12 MED ORDER — OLOPATADINE HCL 0.1 % OP SOLN: 1.0000 [drp] | Freq: Two times a day (BID) | OPHTHALMIC | 5 refills | Status: DC

## 2018-01-12 NOTE — Telephone Encounter (Signed)
Please advise 

## 2018-01-12 NOTE — Telephone Encounter (Signed)
Which strength does her insurance cover?

## 2018-01-12 NOTE — Telephone Encounter (Signed)
01

## 2018-01-12 NOTE — Telephone Encounter (Signed)
Sent in rx.

## 2018-01-12 NOTE — Telephone Encounter (Signed)
Olopatadine 0.1%, 1 drop per eye twice daily as needed.

## 2018-01-24 ENCOUNTER — Telehealth: Payer: Self-pay

## 2018-01-24 NOTE — Telephone Encounter (Signed)
Opened in error

## 2018-02-01 DIAGNOSIS — E1065 Type 1 diabetes mellitus with hyperglycemia: Secondary | ICD-10-CM | POA: Diagnosis not present

## 2018-02-01 DIAGNOSIS — E10649 Type 1 diabetes mellitus with hypoglycemia without coma: Secondary | ICD-10-CM | POA: Diagnosis not present

## 2018-03-30 DIAGNOSIS — E109 Type 1 diabetes mellitus without complications: Secondary | ICD-10-CM | POA: Diagnosis not present

## 2018-04-14 DIAGNOSIS — J0100 Acute maxillary sinusitis, unspecified: Secondary | ICD-10-CM | POA: Diagnosis not present

## 2018-07-06 ENCOUNTER — Other Ambulatory Visit: Payer: Self-pay | Admitting: Allergy and Immunology

## 2018-07-06 DIAGNOSIS — J3089 Other allergic rhinitis: Secondary | ICD-10-CM

## 2018-07-06 DIAGNOSIS — H1013 Acute atopic conjunctivitis, bilateral: Secondary | ICD-10-CM

## 2018-07-12 DIAGNOSIS — E10649 Type 1 diabetes mellitus with hypoglycemia without coma: Secondary | ICD-10-CM | POA: Diagnosis not present

## 2018-07-12 DIAGNOSIS — E039 Hypothyroidism, unspecified: Secondary | ICD-10-CM | POA: Diagnosis not present

## 2018-07-14 DIAGNOSIS — E1065 Type 1 diabetes mellitus with hyperglycemia: Secondary | ICD-10-CM | POA: Diagnosis not present

## 2018-09-04 ENCOUNTER — Other Ambulatory Visit: Payer: Self-pay | Admitting: Family Medicine

## 2018-09-04 DIAGNOSIS — Z1231 Encounter for screening mammogram for malignant neoplasm of breast: Secondary | ICD-10-CM

## 2018-09-08 ENCOUNTER — Other Ambulatory Visit: Payer: Self-pay | Admitting: Allergy and Immunology

## 2018-09-08 DIAGNOSIS — H1013 Acute atopic conjunctivitis, bilateral: Secondary | ICD-10-CM

## 2018-09-08 DIAGNOSIS — J3089 Other allergic rhinitis: Secondary | ICD-10-CM

## 2018-09-14 DIAGNOSIS — Z23 Encounter for immunization: Secondary | ICD-10-CM | POA: Diagnosis not present

## 2018-09-14 DIAGNOSIS — Z0000 Encounter for general adult medical examination without abnormal findings: Secondary | ICD-10-CM | POA: Diagnosis not present

## 2018-09-14 DIAGNOSIS — E109 Type 1 diabetes mellitus without complications: Secondary | ICD-10-CM | POA: Diagnosis not present

## 2018-10-05 DIAGNOSIS — E109 Type 1 diabetes mellitus without complications: Secondary | ICD-10-CM | POA: Diagnosis not present

## 2018-10-09 DIAGNOSIS — M9902 Segmental and somatic dysfunction of thoracic region: Secondary | ICD-10-CM | POA: Diagnosis not present

## 2018-10-09 DIAGNOSIS — M9901 Segmental and somatic dysfunction of cervical region: Secondary | ICD-10-CM | POA: Diagnosis not present

## 2018-10-09 DIAGNOSIS — M9905 Segmental and somatic dysfunction of pelvic region: Secondary | ICD-10-CM | POA: Diagnosis not present

## 2018-10-09 DIAGNOSIS — M9903 Segmental and somatic dysfunction of lumbar region: Secondary | ICD-10-CM | POA: Diagnosis not present

## 2018-10-11 DIAGNOSIS — M9903 Segmental and somatic dysfunction of lumbar region: Secondary | ICD-10-CM | POA: Diagnosis not present

## 2018-10-11 DIAGNOSIS — M9901 Segmental and somatic dysfunction of cervical region: Secondary | ICD-10-CM | POA: Diagnosis not present

## 2018-10-11 DIAGNOSIS — M9902 Segmental and somatic dysfunction of thoracic region: Secondary | ICD-10-CM | POA: Diagnosis not present

## 2018-10-11 DIAGNOSIS — M9905 Segmental and somatic dysfunction of pelvic region: Secondary | ICD-10-CM | POA: Diagnosis not present

## 2018-10-16 DIAGNOSIS — M9901 Segmental and somatic dysfunction of cervical region: Secondary | ICD-10-CM | POA: Diagnosis not present

## 2018-10-16 DIAGNOSIS — M9902 Segmental and somatic dysfunction of thoracic region: Secondary | ICD-10-CM | POA: Diagnosis not present

## 2018-10-16 DIAGNOSIS — M9903 Segmental and somatic dysfunction of lumbar region: Secondary | ICD-10-CM | POA: Diagnosis not present

## 2018-10-16 DIAGNOSIS — M9905 Segmental and somatic dysfunction of pelvic region: Secondary | ICD-10-CM | POA: Diagnosis not present

## 2018-10-17 ENCOUNTER — Ambulatory Visit
Admission: RE | Admit: 2018-10-17 | Discharge: 2018-10-17 | Disposition: A | Discharge status: Home / Self Care | Payer: BLUE CROSS/BLUE SHIELD | Source: Ambulatory Visit | Attending: Family Medicine | Admitting: Family Medicine

## 2018-10-17 DIAGNOSIS — Z1231 Encounter for screening mammogram for malignant neoplasm of breast: Secondary | ICD-10-CM

## 2018-10-23 DIAGNOSIS — M9903 Segmental and somatic dysfunction of lumbar region: Secondary | ICD-10-CM | POA: Diagnosis not present

## 2018-10-23 DIAGNOSIS — M9902 Segmental and somatic dysfunction of thoracic region: Secondary | ICD-10-CM | POA: Diagnosis not present

## 2018-10-23 DIAGNOSIS — M9905 Segmental and somatic dysfunction of pelvic region: Secondary | ICD-10-CM | POA: Diagnosis not present

## 2018-10-23 DIAGNOSIS — M9901 Segmental and somatic dysfunction of cervical region: Secondary | ICD-10-CM | POA: Diagnosis not present

## 2018-11-02 ENCOUNTER — Other Ambulatory Visit: Payer: Self-pay | Admitting: Allergy and Immunology

## 2018-11-22 DIAGNOSIS — Z01419 Encounter for gynecological examination (general) (routine) without abnormal findings: Secondary | ICD-10-CM | POA: Diagnosis not present

## 2018-11-22 DIAGNOSIS — Z6821 Body mass index (BMI) 21.0-21.9, adult: Secondary | ICD-10-CM | POA: Diagnosis not present

## 2018-12-03 ENCOUNTER — Other Ambulatory Visit: Payer: Self-pay | Admitting: Allergy and Immunology

## 2018-12-03 DIAGNOSIS — J3089 Other allergic rhinitis: Secondary | ICD-10-CM

## 2018-12-03 DIAGNOSIS — H1013 Acute atopic conjunctivitis, bilateral: Secondary | ICD-10-CM

## 2018-12-26 ENCOUNTER — Other Ambulatory Visit: Payer: Self-pay | Admitting: Allergy and Immunology

## 2018-12-26 DIAGNOSIS — J3089 Other allergic rhinitis: Secondary | ICD-10-CM

## 2018-12-26 DIAGNOSIS — H1013 Acute atopic conjunctivitis, bilateral: Secondary | ICD-10-CM

## 2019-02-01 ENCOUNTER — Other Ambulatory Visit: Payer: Self-pay | Admitting: Allergy and Immunology

## 2019-02-01 DIAGNOSIS — H1013 Acute atopic conjunctivitis, bilateral: Secondary | ICD-10-CM

## 2019-02-01 DIAGNOSIS — J3089 Other allergic rhinitis: Secondary | ICD-10-CM

## 2019-02-05 ENCOUNTER — Encounter: Payer: Self-pay | Admitting: Allergy and Immunology

## 2019-02-05 ENCOUNTER — Other Ambulatory Visit: Payer: Self-pay

## 2019-02-05 ENCOUNTER — Ambulatory Visit (INDEPENDENT_AMBULATORY_CARE_PROVIDER_SITE_OTHER): Payer: BLUE CROSS/BLUE SHIELD | Admitting: Allergy and Immunology

## 2019-02-05 DIAGNOSIS — J3089 Other allergic rhinitis: Secondary | ICD-10-CM | POA: Diagnosis not present

## 2019-02-05 DIAGNOSIS — L2089 Other atopic dermatitis: Secondary | ICD-10-CM | POA: Diagnosis not present

## 2019-02-05 DIAGNOSIS — R040 Epistaxis: Secondary | ICD-10-CM | POA: Insufficient documentation

## 2019-02-05 DIAGNOSIS — H1013 Acute atopic conjunctivitis, bilateral: Secondary | ICD-10-CM

## 2019-02-05 MED ORDER — CRISABOROLE 2 % EX OINT: 1.0000 "application " | TOPICAL_OINTMENT | Freq: Two times a day (BID) | CUTANEOUS | 2 refills | Status: AC | PRN

## 2019-02-05 MED ORDER — AZELASTINE HCL 0.15 % NA SOLN: 1.0000 | Freq: Two times a day (BID) | NASAL | 3 refills | Status: AC | PRN

## 2019-02-05 MED ORDER — MONTELUKAST SODIUM 10 MG PO TABS: 10.0000 mg | ORAL_TABLET | Freq: Every day | ORAL | 0 refills | Status: DC

## 2019-02-05 MED ORDER — OLOPATADINE HCL 0.2 % OP SOLN: 1.0000 [drp] | OPHTHALMIC | 5 refills | Status: AC

## 2019-02-05 MED ORDER — AZELASTINE HCL 0.15 % NA SOLN: 1.0000 | Freq: Two times a day (BID) | NASAL | 3 refills | Status: DC | PRN

## 2019-02-05 MED ORDER — LEVOCETIRIZINE DIHYDROCHLORIDE 5 MG PO TABS: 5.0000 mg | ORAL_TABLET | Freq: Every day | ORAL | 0 refills | Status: DC | PRN

## 2019-02-05 NOTE — Assessment & Plan Note (Addendum)
   Treatment plan as outlined above for allergic rhinitis.  A prescription has been provided for Pataday, one drop per eye daily as needed.  I have also recommended eye lubricant drops (i.e., Natural Tears) as needed. 

## 2019-02-05 NOTE — Patient Instructions (Addendum)
Allergic rhinitis  Continue appropriate aeroallergen avoidance measures.  A prescription has been provided for azelastine nasal spray, 1-2 sprays per nostril 2 times daily as needed. Proper nasal spray technique has been discussed and demonstrated.   Discontinue fluticasone nasal spray.  Nasal saline spray (i.e., Simply Saline) or nasal saline lavage (i.e., NeilMed) is recommended as needed and prior to medicated nasal sprays.  Continue montelukast 10 mg daily at bedtime and levocetirizine 5 mg daily as needed.  To avoid diminishing benefit with daily use (tachyphylaxis) of second generation antihistamine, consider alternating every few months between fexofenadine (Allegra) and levocetirizine (Xyzal).  Refill prescriptions have been provided.  If allergen avoidance measures and medications fail to adequately relieve symptoms, aeroallergen immunotherapy will be considered.  Allergic conjunctivitis  Treatment plan as outlined above for allergic rhinitis.  A prescription has been provided for Pataday, one drop per eye daily as needed.  I have also recommended eye lubricant drops (i.e., Natural Tears) as needed.  Atopic dermatitis  Continue appropriate skin care measures.  A prescription has been provided for Eucrisa (crisaborole) 2% ointment twice a day to affected areas as needed.  A rebate card has been provided.  If this problem persists or progresses, consider patch testing to rule out allergic contact dermatitis.  Epistaxis Mild epistaxis.  Discontinue fluticasone nasal spray.  Nasal saline spray and/or nasal saline gel is recommended to moisturize nasal mucosa.  The use of a cool-mist humidifier during the night is recommended.  During epistaxis, if needed, oxymetazoline (Afrin) nasal sparay may be applied to a cotton ball to help stanch the blood flow.  If this problem persists or progresses, otolaryngology evaluation may be warranted.    Return in about 6 months  (around 08/07/2019), or if symptoms worsen or fail to improve.

## 2019-02-05 NOTE — Assessment & Plan Note (Signed)
   Continue appropriate aeroallergen avoidance measures.  A prescription has been provided for azelastine nasal spray, 1-2 sprays per nostril 2 times daily as needed. Proper nasal spray technique has been discussed and demonstrated.   Discontinue fluticasone nasal spray.  Nasal saline spray (i.e., Simply Saline) or nasal saline lavage (i.e., NeilMed) is recommended as needed and prior to medicated nasal sprays.  Continue montelukast 10 mg daily at bedtime and levocetirizine 5 mg daily as needed.  To avoid diminishing benefit with daily use (tachyphylaxis) of second generation antihistamine, consider alternating every few months between fexofenadine (Allegra) and levocetirizine (Xyzal).  Refill prescriptions have been provided.  If allergen avoidance measures and medications fail to adequately relieve symptoms, aeroallergen immunotherapy will be considered.

## 2019-02-05 NOTE — Progress Notes (Signed)
Follow-up Telemedicine Note  RE: Nancy King MRN: [id number] DOB: [birth year] Date of Telemedicine Visit: 02/05/2019  Primary care provider: Johny Blamer, MD Referring provider: Johny Blamer, MD  Telemedicine Follow Up Visit via Telephone: I connected with Nancy King for a follow up on 02/05/19 by telephone and verified that I am speaking with the correct person using two identifiers.   The limitations, risks, security and privacy concerns of performing an evaluation and management service by telemedicine, the availability of in person appointments, and that there may be a patient responsible charge related to this service were discussed. The patient expressed understanding and agreed to proceed.  Patient is at home.  Provider is at the office.  Visit start time: 3:25 pm Visit end time: 3:58 pm Insurance consent/check in by: Hilda Lias Medical consent and medical assistant/nurse: Morrie Sheldon  History of present illness: Nancy King is a 48 y.o. female with allergic rhinoconjunctivitis and atopic dermatitis presenting today via telephone for follow-up.  She was previously seen in this clinic for her initial evaluation in March 2019.  She complains of itchy, red, watery eyes.  She is currently using Patanol (olopatadine 0.1%) twice daily.  She also reports that she has been experiencing "reddish purplish" dry, itchy patches under her eyes.  These symptoms seem to be triggered by exposure to dust, cats, and pollen.  She is attempting to control the rash under her eyes with Vaseline petrolatum.  She has been experiencing some nasal congestion, rhinorrhea, and postnasal drainage.  She admits that she is not using Xhance nasal spray because she was concerned about her family history of glaucoma and personal history of diabetes.  Instead of Onset, she started using Flonase.  She reports that she has been experiencing a small amount of blood in the mucus when she blows her nose.  She has not  experienced flowing epistaxis.  Assessment and plan: Allergic rhinitis  Continue appropriate aeroallergen avoidance measures.  A prescription has been provided for azelastine nasal spray, 1-2 sprays per nostril 2 times daily as needed. Proper nasal spray technique has been discussed and demonstrated.   Discontinue fluticasone nasal spray.  Nasal saline spray (i.e., Simply Saline) or nasal saline lavage (i.e., NeilMed) is recommended as needed and prior to medicated nasal sprays.  Continue montelukast 10 mg daily at bedtime and levocetirizine 5 mg daily as needed.  To avoid diminishing benefit with daily use (tachyphylaxis) of second generation antihistamine, consider alternating every few months between fexofenadine (Allegra) and levocetirizine (Xyzal).  Refill prescriptions have been provided.  If allergen avoidance measures and medications fail to adequately relieve symptoms, aeroallergen immunotherapy will be considered.  Allergic conjunctivitis  Treatment plan as outlined above for allergic rhinitis.  A prescription has been provided for Pataday, one drop per eye daily as needed.  I have also recommended eye lubricant drops (i.e., Natural Tears) as needed.  Atopic dermatitis  Continue appropriate skin care measures.  A prescription has been provided for Eucrisa (crisaborole) 2% ointment twice a day to affected areas as needed.  A rebate card has been provided.  If this problem persists or progresses, consider patch testing to rule out allergic contact dermatitis.  Epistaxis Mild epistaxis.  Discontinue fluticasone nasal spray.  Nasal saline spray and/or nasal saline gel is recommended to moisturize nasal mucosa.  The use of a cool-mist humidifier during the night is recommended.  During epistaxis, if needed, oxymetazoline (Afrin) nasal sparay may be applied to a cotton ball to help stanch the blood flow.  If this problem persists or progresses, otolaryngology  evaluation may be warranted.    Meds ordered this encounter  Medications  . Crisaborole (EUCRISA) 2 % OINT    Sig: Apply 1 application topically 2 (two) times daily as needed.    Dispense:  60 g    Refill:  2    [phone]  . levocetirizine (XYZAL) 5 MG tablet    Sig: Take 1 tablet (5 mg total) by mouth daily as needed for allergies.    Dispense:  90 tablet    Refill:  0  . montelukast (SINGULAIR) 10 MG tablet    Sig: Take 1 tablet (10 mg total) by mouth at bedtime.    Dispense:  90 tablet    Refill:  0  . DISCONTD: Azelastine HCl 0.15 % SOLN    Sig: Place 1-2 sprays into the nose 2 (two) times daily as needed.    Dispense:  30 mL    Refill:  3  . Olopatadine HCl (PATADAY) 0.2 % SOLN    Sig: Place 1 drop into both eyes 1 day or 1 dose.    Dispense:  1 Bottle    Refill:  5  . Azelastine HCl 0.15 % SOLN    Sig: Place 1-2 sprays into the nose 2 (two) times daily as needed.    Dispense:  30 mL    Refill:  3    Diagnostics: None.   Physical examination: Physical Exam Not obtained as encounter was done via telephone.   The following portions of the patient's history were reviewed and updated as appropriate: allergies, current medications, past family history, past medical history, past social history, past surgical history and problem list.  Allergies as of 02/05/2019   No Known Allergies     Medication List       Accurate as of February 05, 2019  7:47 PM. Always use your most recent med list.        Alaway 0.025 % ophthalmic solution Generic drug:  ketotifen Place 1 drop into both eyes 2 (two) times daily.   aspirin EC 81 MG tablet Take 81 mg by mouth daily.   Azelastine HCl 0.15 % Soln Place 1-2 sprays into the nose 2 (two) times daily as needed.   Crisaborole 2 % Oint Commonly known as:  [place] Apply 1 application topically 2 (two) times daily as needed.   Dexcom G6 Transmitter Misc Use 1 each every 3 (three) months   diphenhydrAMINE 25 MG tablet  Commonly known as:  BENADRYL Take 25 mg by mouth every 4 (four) hours as needed.   Fish Oil 1200 MG Caps Take 1 capsule by mouth daily.   fluticasone 50 MCG/ACT nasal spray Commonly known as:  FLONASE Place 1 spray into both nostrils 2 (two) times daily.   Fluticasone Propionate 93 MCG/ACT Exhu Commonly known as:  Xhance Place 2 puffs into the nose 2 (two) times daily.   levocetirizine 5 MG tablet Commonly known as:  XYZAL Take 1 tablet (5 mg total) by mouth daily as needed for allergies.   montelukast 10 MG tablet Commonly known as:  SINGULAIR Take 1 tablet (10 mg total) by mouth at bedtime.   multivitamin capsule Take 1 capsule by mouth daily.   NOVOLOG  Inject into the skin.   olopatadine 0.1 % ophthalmic solution Commonly known as:  PATANOL INSTILL 1 DROP INTO BOTH EYES TWICE A DAY   Olopatadine HCl 0.2 % Soln Commonly known as:  Pataday Place 1 drop  into both eyes 1 day or 1 dose.   thyroid 60 MG tablet Commonly known as:  ARMOUR Take 60 mg by mouth daily before breakfast.   thyroid 30 MG tablet Commonly known as:  ARMOUR Take 30 mg by mouth every evening.   TURMERIC PO Take 2 tablets by mouth daily.   Vitamin D3 125 MCG (5000 UT) Caps Take 1 capsule by mouth daily.       No Known Allergies  Review of systems: Review of systems negative except as noted in HPI / PMHx or noted below: Constitutional: Negative.  HENT: Negative.   Eyes: Negative.  Respiratory: Negative.   Cardiovascular: Negative.  Gastrointestinal: Negative.  Genitourinary: Negative.  Musculoskeletal: Negative.  Neurological: Negative.  Endo/Heme/Allergies: Negative.  Cutaneous: Negative.  Past Medical History:  Diagnosis Date  . Atopic dermatitis 01/10/2018  . Diabetes mellitus (HCC) 1989  . Thyroid disease     Family History  Problem Relation Age of Onset  . Dementia Mother   . Stroke Father   . Diabetes Paternal Grandmother   . Throat cancer Paternal Grandfather    . Breast cancer Maternal Aunt   . Breast cancer Paternal Aunt     Social History   Socioeconomic History  . Marital status: Married    Spouse name: Not on file  . Number of children: Not on file  . Years of education: Not on file  . Highest education level: Not on file  Occupational History  . Not on file  Social Needs  . Financial resource strain: Not on file  . Food insecurity:    Worry: Not on file    Inability: Not on file  . Transportation needs:    Medical: Not on file    Non-medical: Not on file  Tobacco Use  . Smoking status: Never Smoker  . Smokeless tobacco: Never Used  Substance and Sexual Activity  . Alcohol use: No    Frequency: Never  . Drug use: No  . Sexual activity: Not on file  Lifestyle  . Physical activity:    Days per week: Not on file    Minutes per session: Not on file  . Stress: Not on file  Relationships  . Social connections:    Talks on phone: Not on file    Gets together: Not on file    Attends religious service: Not on file    Active member of club or organization: Not on file    Attends meetings of clubs or organizations: Not on file    Relationship status: Not on file  . Intimate partner violence:    Fear of current or ex partner: Not on file    Emotionally abused: Not on file    Physically abused: Not on file    Forced sexual activity: Not on file  Other Topics Concern  . Not on file  Social History Narrative  . Not on file     Previous notes and tests were reviewed.  I discussed the assessment and treatment plan with the patient. The patient was provided an opportunity to ask questions and all were answered. The patient agreed with the plan and demonstrated an understanding of the instructions.   The patient was advised to call back or seek an in-person evaluation if the symptoms worsen or if the condition fails to improve as anticipated.  I provided 33 minutes of non-face-to-face time during this encounter.  I  appreciate the opportunity to take part in Nancy King's care. Please do not  hesitate to contact me with questions.  Sincerely,   R. Edgar Frisk, MD

## 2019-02-05 NOTE — Assessment & Plan Note (Addendum)
   Continue appropriate skin care measures.  A prescription has been provided for Eucrisa (crisaborole) 2% ointment twice a day to affected areas as needed.  A rebate card has been provided.  If this problem persists or progresses, consider patch testing to rule out allergic contact dermatitis.

## 2019-02-05 NOTE — Assessment & Plan Note (Signed)
Mild epistaxis.  Discontinue fluticasone nasal spray.  Nasal saline spray and/or nasal saline gel is recommended to moisturize nasal mucosa.  The use of a cool-mist humidifier during the night is recommended.  During epistaxis, if needed, oxymetazoline (Afrin) nasal sparay may be applied to a cotton ball to help stanch the blood flow.  If this problem persists or progresses, otolaryngology evaluation may be warranted.

## 2019-03-13 DIAGNOSIS — E1065 Type 1 diabetes mellitus with hyperglycemia: Secondary | ICD-10-CM | POA: Diagnosis not present

## 2019-04-04 ENCOUNTER — Other Ambulatory Visit: Payer: Self-pay | Admitting: Allergy and Immunology

## 2019-04-17 DIAGNOSIS — Z20828 Contact with and (suspected) exposure to other viral communicable diseases: Secondary | ICD-10-CM | POA: Diagnosis not present

## 2019-05-01 ENCOUNTER — Other Ambulatory Visit: Payer: Self-pay | Admitting: Allergy and Immunology

## 2019-07-05 ENCOUNTER — Other Ambulatory Visit: Payer: Self-pay | Admitting: Allergy and Immunology

## 2019-08-06 DIAGNOSIS — M9905 Segmental and somatic dysfunction of pelvic region: Secondary | ICD-10-CM | POA: Diagnosis not present

## 2019-08-06 DIAGNOSIS — M9901 Segmental and somatic dysfunction of cervical region: Secondary | ICD-10-CM | POA: Diagnosis not present

## 2019-08-06 DIAGNOSIS — M9903 Segmental and somatic dysfunction of lumbar region: Secondary | ICD-10-CM | POA: Diagnosis not present

## 2019-08-06 DIAGNOSIS — M9902 Segmental and somatic dysfunction of thoracic region: Secondary | ICD-10-CM | POA: Diagnosis not present

## 2019-08-13 DIAGNOSIS — M9903 Segmental and somatic dysfunction of lumbar region: Secondary | ICD-10-CM | POA: Diagnosis not present

## 2019-08-13 DIAGNOSIS — M9905 Segmental and somatic dysfunction of pelvic region: Secondary | ICD-10-CM | POA: Diagnosis not present

## 2019-08-13 DIAGNOSIS — M9901 Segmental and somatic dysfunction of cervical region: Secondary | ICD-10-CM | POA: Diagnosis not present

## 2019-08-13 DIAGNOSIS — M9902 Segmental and somatic dysfunction of thoracic region: Secondary | ICD-10-CM | POA: Diagnosis not present

## 2019-08-27 DIAGNOSIS — F4322 Adjustment disorder with anxiety: Secondary | ICD-10-CM | POA: Diagnosis not present

## 2019-08-27 DIAGNOSIS — M9905 Segmental and somatic dysfunction of pelvic region: Secondary | ICD-10-CM | POA: Diagnosis not present

## 2019-08-27 DIAGNOSIS — M9901 Segmental and somatic dysfunction of cervical region: Secondary | ICD-10-CM | POA: Diagnosis not present

## 2019-08-27 DIAGNOSIS — M9903 Segmental and somatic dysfunction of lumbar region: Secondary | ICD-10-CM | POA: Diagnosis not present

## 2019-08-27 DIAGNOSIS — M9902 Segmental and somatic dysfunction of thoracic region: Secondary | ICD-10-CM | POA: Diagnosis not present

## 2019-09-14 DIAGNOSIS — F4322 Adjustment disorder with anxiety: Secondary | ICD-10-CM | POA: Diagnosis not present

## 2019-10-08 DIAGNOSIS — M9902 Segmental and somatic dysfunction of thoracic region: Secondary | ICD-10-CM | POA: Diagnosis not present

## 2019-10-08 DIAGNOSIS — M9903 Segmental and somatic dysfunction of lumbar region: Secondary | ICD-10-CM | POA: Diagnosis not present

## 2019-10-08 DIAGNOSIS — M9901 Segmental and somatic dysfunction of cervical region: Secondary | ICD-10-CM | POA: Diagnosis not present

## 2019-10-08 DIAGNOSIS — M9905 Segmental and somatic dysfunction of pelvic region: Secondary | ICD-10-CM | POA: Diagnosis not present

## 2019-10-19 DIAGNOSIS — F4322 Adjustment disorder with anxiety: Secondary | ICD-10-CM | POA: Diagnosis not present

## 2019-11-06 DIAGNOSIS — M9902 Segmental and somatic dysfunction of thoracic region: Secondary | ICD-10-CM | POA: Diagnosis not present

## 2019-11-06 DIAGNOSIS — M9905 Segmental and somatic dysfunction of pelvic region: Secondary | ICD-10-CM | POA: Diagnosis not present

## 2019-11-06 DIAGNOSIS — M9903 Segmental and somatic dysfunction of lumbar region: Secondary | ICD-10-CM | POA: Diagnosis not present

## 2019-11-06 DIAGNOSIS — M9901 Segmental and somatic dysfunction of cervical region: Secondary | ICD-10-CM | POA: Diagnosis not present

## 2019-11-09 DIAGNOSIS — Z20828 Contact with and (suspected) exposure to other viral communicable diseases: Secondary | ICD-10-CM | POA: Diagnosis not present

## 2019-11-29 DIAGNOSIS — F4322 Adjustment disorder with anxiety: Secondary | ICD-10-CM | POA: Diagnosis not present

## 2019-12-04 DIAGNOSIS — E1065 Type 1 diabetes mellitus with hyperglycemia: Secondary | ICD-10-CM | POA: Diagnosis not present

## 2019-12-05 ENCOUNTER — Other Ambulatory Visit: Payer: Self-pay | Admitting: Family Medicine

## 2019-12-05 DIAGNOSIS — Z1231 Encounter for screening mammogram for malignant neoplasm of breast: Secondary | ICD-10-CM

## 2019-12-17 DIAGNOSIS — F411 Generalized anxiety disorder: Secondary | ICD-10-CM | POA: Diagnosis not present

## 2019-12-19 DIAGNOSIS — M9905 Segmental and somatic dysfunction of pelvic region: Secondary | ICD-10-CM | POA: Diagnosis not present

## 2019-12-19 DIAGNOSIS — M9902 Segmental and somatic dysfunction of thoracic region: Secondary | ICD-10-CM | POA: Diagnosis not present

## 2019-12-19 DIAGNOSIS — M9903 Segmental and somatic dysfunction of lumbar region: Secondary | ICD-10-CM | POA: Diagnosis not present

## 2019-12-19 DIAGNOSIS — M9901 Segmental and somatic dysfunction of cervical region: Secondary | ICD-10-CM | POA: Diagnosis not present

## 2020-01-04 ENCOUNTER — Other Ambulatory Visit: Payer: Self-pay | Admitting: Allergy and Immunology

## 2020-01-07 DIAGNOSIS — F411 Generalized anxiety disorder: Secondary | ICD-10-CM | POA: Diagnosis not present

## 2020-01-15 ENCOUNTER — Other Ambulatory Visit: Payer: Self-pay

## 2020-01-15 ENCOUNTER — Ambulatory Visit
Admission: RE | Admit: 2020-01-15 | Discharge: 2020-01-15 | Disposition: A | Discharge status: Home / Self Care | Payer: BLUE CROSS/BLUE SHIELD | Source: Ambulatory Visit | Attending: Family Medicine | Admitting: Family Medicine

## 2020-01-15 DIAGNOSIS — Z1231 Encounter for screening mammogram for malignant neoplasm of breast: Secondary | ICD-10-CM | POA: Diagnosis not present

## 2020-01-16 DIAGNOSIS — M9905 Segmental and somatic dysfunction of pelvic region: Secondary | ICD-10-CM | POA: Diagnosis not present

## 2020-01-16 DIAGNOSIS — M9901 Segmental and somatic dysfunction of cervical region: Secondary | ICD-10-CM | POA: Diagnosis not present

## 2020-01-16 DIAGNOSIS — M9903 Segmental and somatic dysfunction of lumbar region: Secondary | ICD-10-CM | POA: Diagnosis not present

## 2020-01-16 DIAGNOSIS — M9902 Segmental and somatic dysfunction of thoracic region: Secondary | ICD-10-CM | POA: Diagnosis not present

## 2020-01-17 DIAGNOSIS — E10649 Type 1 diabetes mellitus with hypoglycemia without coma: Secondary | ICD-10-CM | POA: Diagnosis not present

## 2020-01-17 DIAGNOSIS — E039 Hypothyroidism, unspecified: Secondary | ICD-10-CM | POA: Diagnosis not present

## 2020-01-17 DIAGNOSIS — R748 Abnormal levels of other serum enzymes: Secondary | ICD-10-CM | POA: Diagnosis not present

## 2020-01-21 DIAGNOSIS — E109 Type 1 diabetes mellitus without complications: Secondary | ICD-10-CM | POA: Diagnosis not present

## 2020-01-31 ENCOUNTER — Other Ambulatory Visit: Payer: Self-pay | Admitting: Allergy and Immunology

## 2020-02-07 ENCOUNTER — Other Ambulatory Visit: Payer: Self-pay | Admitting: Allergy and Immunology

## 2020-02-11 ENCOUNTER — Ambulatory Visit: Payer: BC Managed Care – PPO | Admitting: Allergy and Immunology

## 2020-02-11 DIAGNOSIS — F411 Generalized anxiety disorder: Secondary | ICD-10-CM | POA: Diagnosis not present

## 2020-02-13 DIAGNOSIS — H04123 Dry eye syndrome of bilateral lacrimal glands: Secondary | ICD-10-CM | POA: Diagnosis not present

## 2020-02-18 DIAGNOSIS — Z1151 Encounter for screening for human papillomavirus (HPV): Secondary | ICD-10-CM | POA: Diagnosis not present

## 2020-02-18 DIAGNOSIS — Z6821 Body mass index (BMI) 21.0-21.9, adult: Secondary | ICD-10-CM | POA: Diagnosis not present

## 2020-02-18 DIAGNOSIS — Z01419 Encounter for gynecological examination (general) (routine) without abnormal findings: Secondary | ICD-10-CM | POA: Diagnosis not present

## 2020-03-02 ENCOUNTER — Other Ambulatory Visit: Payer: Self-pay | Admitting: Allergy and Immunology

## 2020-03-03 ENCOUNTER — Other Ambulatory Visit: Payer: Self-pay | Admitting: Allergy and Immunology

## 2020-03-05 ENCOUNTER — Other Ambulatory Visit: Payer: Self-pay | Admitting: Allergy and Immunology

## 2020-03-25 DIAGNOSIS — F411 Generalized anxiety disorder: Secondary | ICD-10-CM | POA: Diagnosis not present

## 2020-05-06 DIAGNOSIS — M9903 Segmental and somatic dysfunction of lumbar region: Secondary | ICD-10-CM | POA: Diagnosis not present

## 2020-05-06 DIAGNOSIS — F411 Generalized anxiety disorder: Secondary | ICD-10-CM | POA: Diagnosis not present

## 2020-05-06 DIAGNOSIS — M9905 Segmental and somatic dysfunction of pelvic region: Secondary | ICD-10-CM | POA: Diagnosis not present

## 2020-05-06 DIAGNOSIS — M9901 Segmental and somatic dysfunction of cervical region: Secondary | ICD-10-CM | POA: Diagnosis not present

## 2020-05-06 DIAGNOSIS — M9902 Segmental and somatic dysfunction of thoracic region: Secondary | ICD-10-CM | POA: Diagnosis not present

## 2020-06-17 DIAGNOSIS — F411 Generalized anxiety disorder: Secondary | ICD-10-CM | POA: Diagnosis not present

## 2020-06-19 DIAGNOSIS — E1065 Type 1 diabetes mellitus with hyperglycemia: Secondary | ICD-10-CM | POA: Diagnosis not present

## 2020-10-08 DIAGNOSIS — M9902 Segmental and somatic dysfunction of thoracic region: Secondary | ICD-10-CM | POA: Diagnosis not present

## 2020-10-08 DIAGNOSIS — M9905 Segmental and somatic dysfunction of pelvic region: Secondary | ICD-10-CM | POA: Diagnosis not present

## 2020-10-08 DIAGNOSIS — M9903 Segmental and somatic dysfunction of lumbar region: Secondary | ICD-10-CM | POA: Diagnosis not present

## 2020-10-08 DIAGNOSIS — M9901 Segmental and somatic dysfunction of cervical region: Secondary | ICD-10-CM | POA: Diagnosis not present

## 2023-01-06 ENCOUNTER — Other Ambulatory Visit: Payer: Self-pay | Admitting: *Deleted

## 2023-01-06 DIAGNOSIS — E063 Autoimmune thyroiditis: Secondary | ICD-10-CM
